# Patient Record
Sex: Male | Born: 1975 | Race: White | Hispanic: No | Marital: Married | Smoking: Never smoker
Health system: Southern US, Community
[De-identification: ages and names within clinical notes are randomized; demographics above are authoritative.]

## PROBLEM LIST (undated history)

## (undated) DIAGNOSIS — E119 Type 2 diabetes mellitus without complications: Secondary | ICD-10-CM

## (undated) DIAGNOSIS — I10 Essential (primary) hypertension: Secondary | ICD-10-CM

## (undated) DIAGNOSIS — F419 Anxiety disorder, unspecified: Secondary | ICD-10-CM

## (undated) HISTORY — PX: MYRINGOTOMY: SUR874

## (undated) HISTORY — PX: COLONOSCOPY: SHX174

---

## 2006-09-05 ENCOUNTER — Emergency Department: Payer: Self-pay | Admitting: Emergency Medicine

## 2007-09-04 ENCOUNTER — Emergency Department: Payer: Self-pay | Admitting: Emergency Medicine

## 2017-09-15 ENCOUNTER — Other Ambulatory Visit: Payer: Self-pay | Admitting: Family Medicine

## 2017-09-15 ENCOUNTER — Ambulatory Visit
Admission: RE | Admit: 2017-09-15 | Discharge: 2017-09-15 | Disposition: A | Discharge status: Home / Self Care | Payer: BLUE CROSS/BLUE SHIELD | Source: Ambulatory Visit | Attending: Family Medicine | Admitting: Family Medicine

## 2017-09-15 DIAGNOSIS — N433 Hydrocele, unspecified: Secondary | ICD-10-CM | POA: Diagnosis not present

## 2017-09-15 DIAGNOSIS — I861 Scrotal varices: Secondary | ICD-10-CM | POA: Insufficient documentation

## 2017-09-15 DIAGNOSIS — N509 Disorder of male genital organs, unspecified: Secondary | ICD-10-CM | POA: Insufficient documentation

## 2017-09-15 DIAGNOSIS — N5089 Other specified disorders of the male genital organs: Secondary | ICD-10-CM

## 2019-02-09 ENCOUNTER — Other Ambulatory Visit: Payer: Self-pay | Admitting: Family Medicine

## 2019-02-09 DIAGNOSIS — Z20822 Contact with and (suspected) exposure to covid-19: Secondary | ICD-10-CM

## 2019-02-12 LAB — NOVEL CORONAVIRUS, NAA: SARS-CoV-2, NAA: NOT DETECTED

## 2019-02-17 IMAGING — US US SCROTUM W/ DOPPLER COMPLETE
1 series · 14 of 25 positions shown · non-contrast
Comparison: None.

CLINICAL DATA: Nontender left scrotal mass.

EXAM:
SCROTAL ULTRASOUND
DOPPLER ULTRASOUND OF THE TESTICLES
TECHNIQUE: Complete ultrasound examination of the testicles, epididymis, and
other scrotal structures was performed. Color and spectral Doppler
ultrasound were also utilized to evaluate blood flow to the
testicles.

[Series 1: us scrotum w/ doppler complete · 0.07mm/px · 14 of 52 slices shown]
[im 1/52]
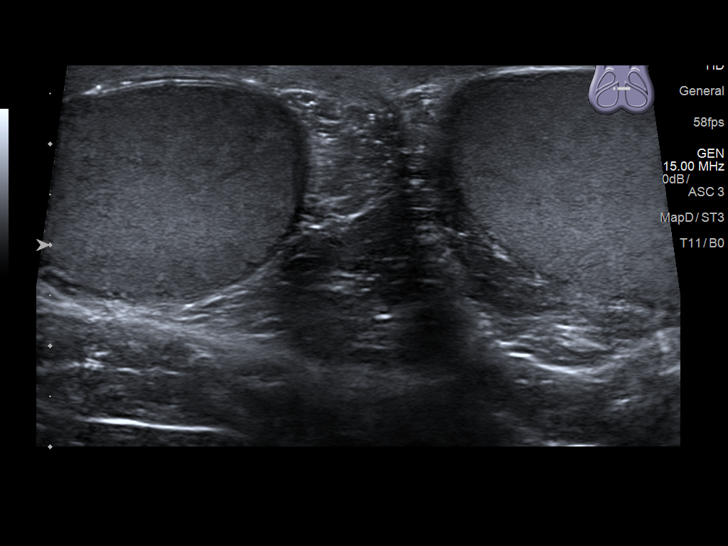
[im 5/52]
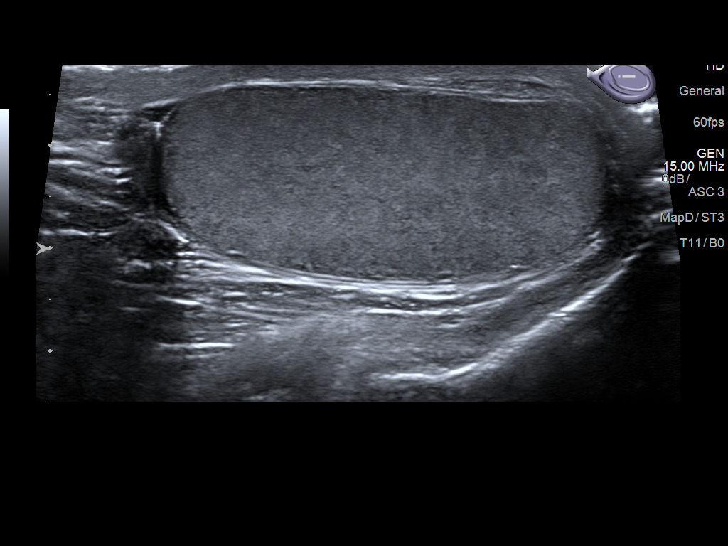
[im 9/52]
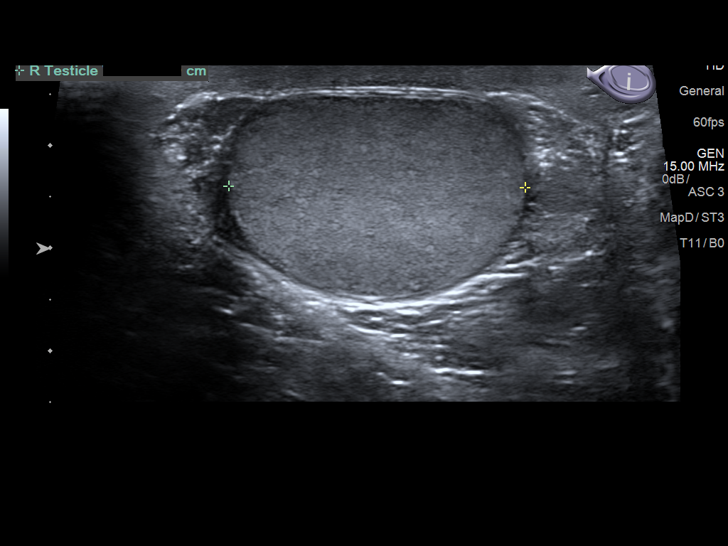
[im 13/52]
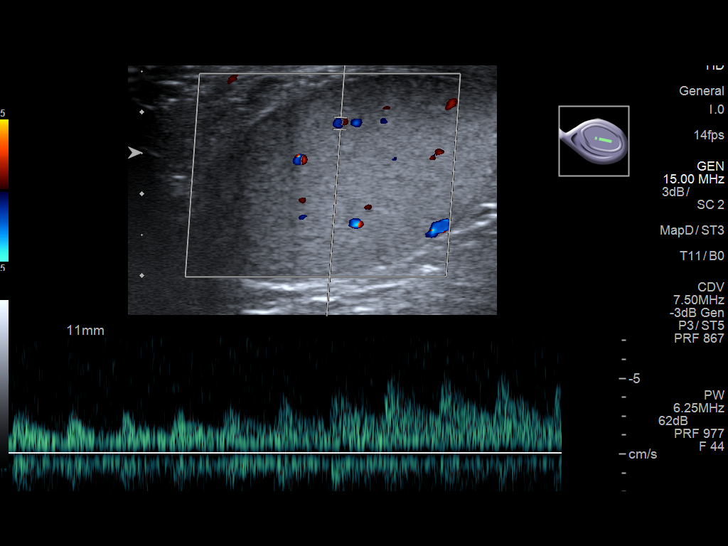
[im 18/52]
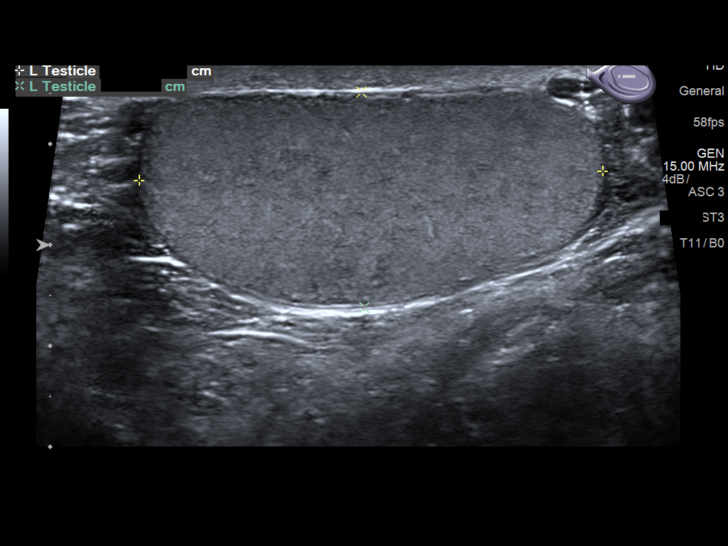
[im 20/52]
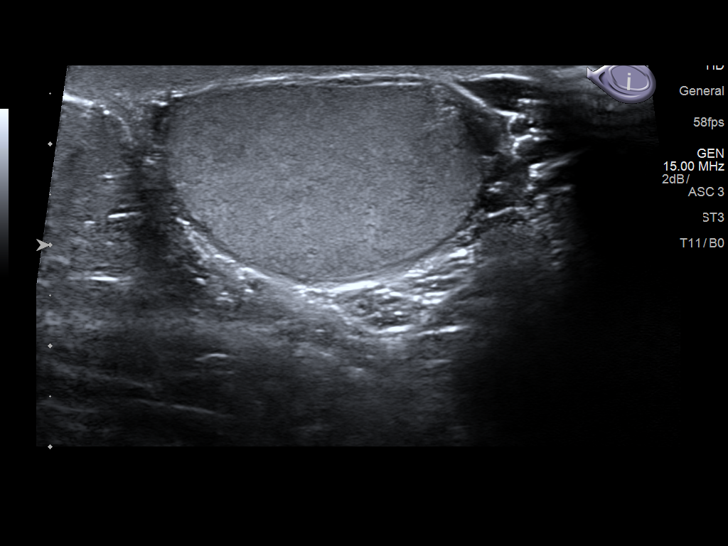
[im 24/52]
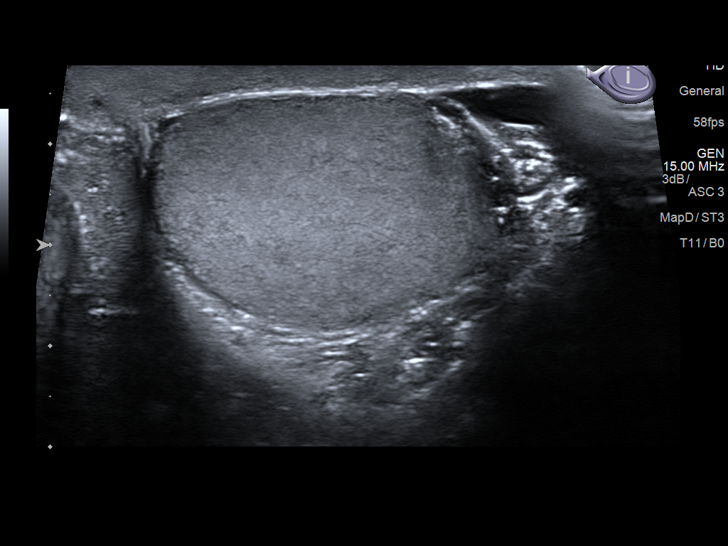
[im 28/52]
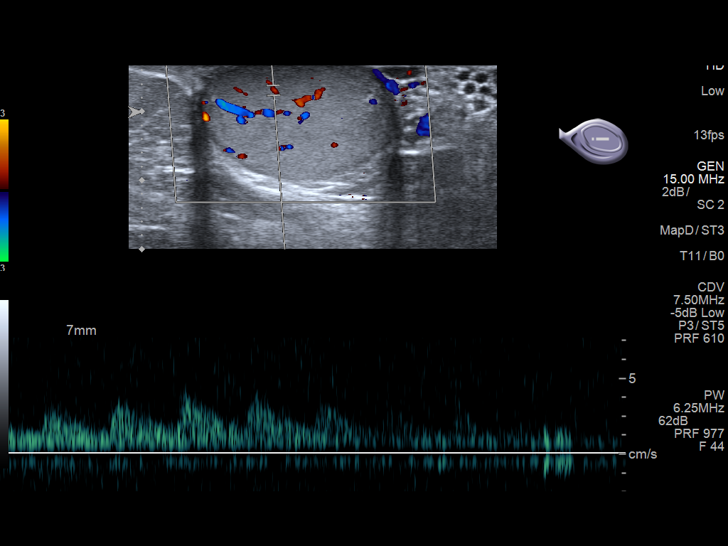
[im 32/52]
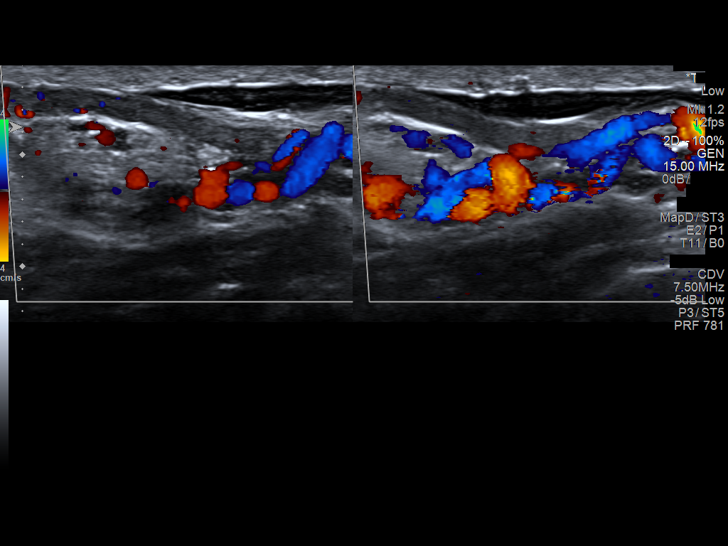
[im 35/52]
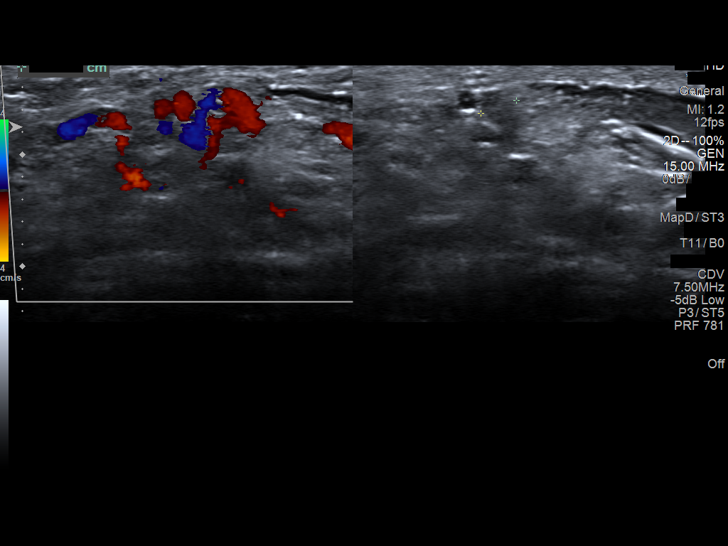
[im 39/52]
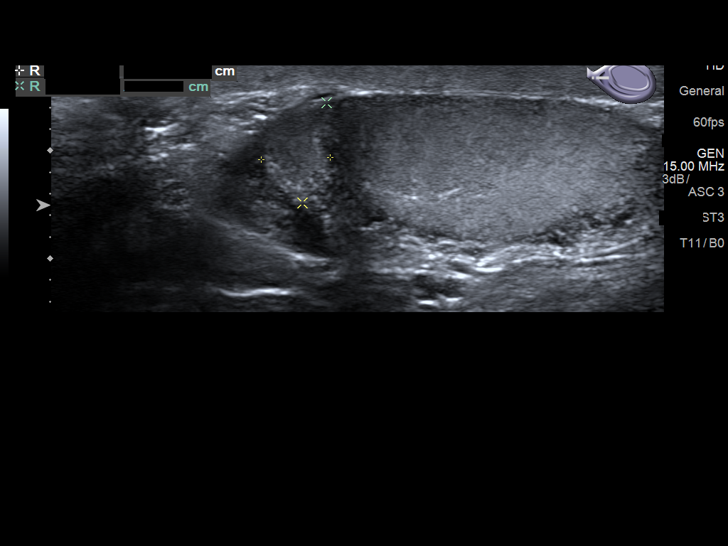
[im 43/52]
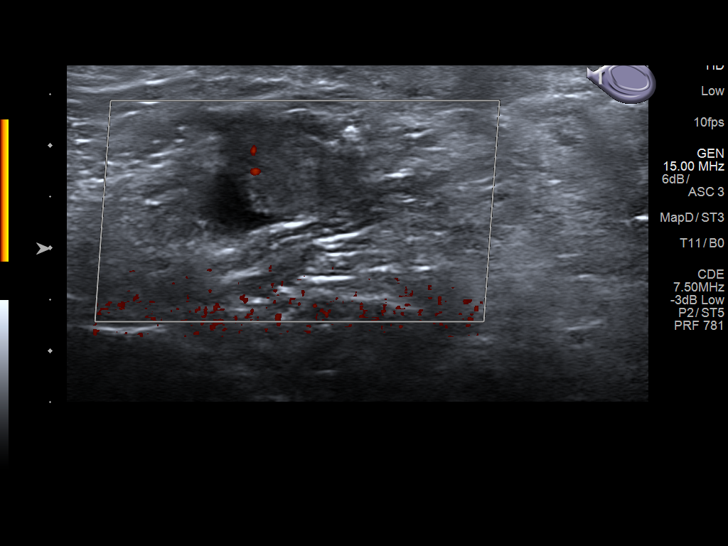
[im 47/52]
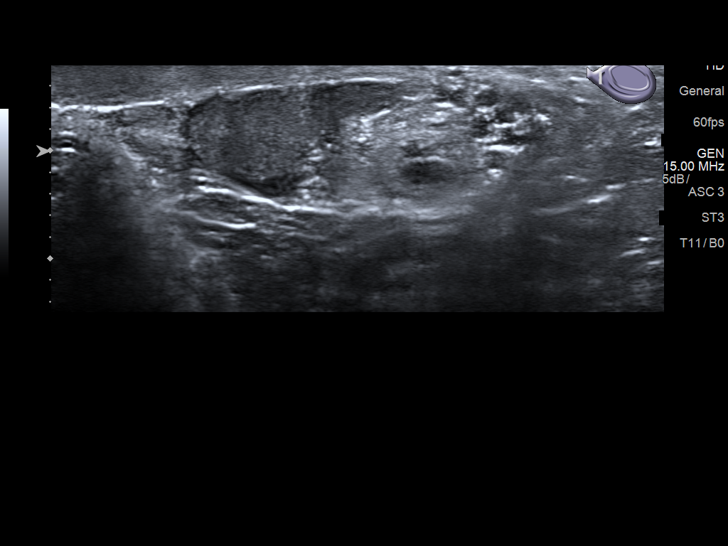
[im 52/52]
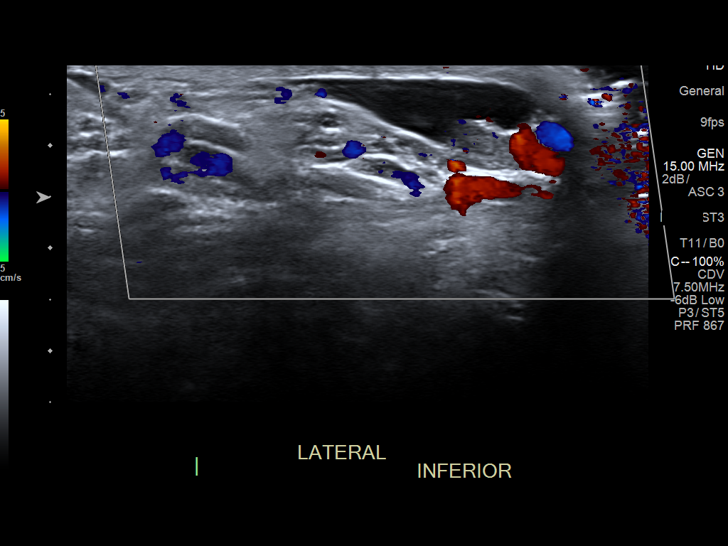

[14 of 25 positions shown; findings below may reference images not displayed]

FINDINGS: Right testicle

Measurements: 4.3 x 1.9 x 2.9 cm. No mass or microlithiasis
visualized.

Left testicle

Measurements: 4.6 x 2.1 x 3.1 cm. No mass or microlithiasis
visualized.

Right epididymis:  Normal in size and appearance.

Left epididymis:  Normal in size and appearance.

Hydrocele: Small left hydrocele.

Varicocele:  Left varicocele.

Pulsed Doppler interrogation of both testes demonstrates normal low
resistance arterial and venous waveforms bilaterally.
IMPRESSION: Left varicocele. Small left hydrocele. Normal appearing testicles.
No visible scrotal mass other than the varicocele.

## 2019-08-01 ENCOUNTER — Other Ambulatory Visit: Payer: Self-pay

## 2019-08-01 ENCOUNTER — Encounter: Payer: Self-pay | Admitting: Emergency Medicine

## 2019-08-01 ENCOUNTER — Ambulatory Visit
Admission: EM | Admit: 2019-08-01 | Discharge: 2019-08-01 | Disposition: A | Discharge status: Home / Self Care | Payer: BLUE CROSS/BLUE SHIELD | Attending: Family Medicine | Admitting: Family Medicine

## 2019-08-01 DIAGNOSIS — S90121A Contusion of right lesser toe(s) without damage to nail, initial encounter: Secondary | ICD-10-CM

## 2019-08-01 DIAGNOSIS — W2201XA Walked into wall, initial encounter: Secondary | ICD-10-CM | POA: Diagnosis not present

## 2019-08-01 HISTORY — DX: Essential (primary) hypertension: I10

## 2019-08-01 HISTORY — DX: Anxiety disorder, unspecified: F41.9

## 2019-08-01 NOTE — ED Triage Notes (Signed)
Patient states he hit his 4th toe on his right foot last night. He called out of work today and needs a note for work.

## 2019-08-01 NOTE — ED Provider Notes (Signed)
[facility] ____________________________________________  Time seen: Approximately 6:05 PM  I have reviewed the triage vital signs and the nursing notes.   HISTORY  Chief Complaint Toe Pain   HPI Javier Drake is a 44 y.o. male presenting for evaluation of injury to right fourth toe.  Patient states that last night in his house he accidentally stubbed his toe on the corner of the wall or the door.  Reports he has had pain to that toe since.  States right now he is feeling better after taking ibuprofen and applying ice.  States he works at [employer] and is on his feet a lot and due to that he called out of work today and needed a work note.  States able to still move the toe well.  Denies pain radiation, paresthesias or other injuries.  Reports otherwise doing well.   Past Medical History:  Diagnosis Date  . Anxiety   . Hypertension     There are no problems to display for this patient.   History reviewed. No pertinent surgical history.   No current facility-administered medications for this encounter.  Current Outpatient Medications:  .  aspirin 325 MG tablet, Take by mouth., Disp: , Rfl:  .  lisinopril-hydrochlorothiazide (ZESTORETIC) 20-25 MG tablet, Take by mouth., Disp: , Rfl:  .  sertraline (ZOLOFT) 100 MG tablet, Take by mouth., Disp: , Rfl:   Allergies Penicillin g  Family History  Problem Relation Age of Onset  . Healthy Mother   . Healthy Father     Social History Social History   Tobacco Use  . Smoking status: Never Smoker  . Smokeless tobacco: Never Used  Substance Use Topics  . Alcohol use: Yes  . Drug use: Never    Review of Systems Constitutional: No fever ENT: No sore throat. Cardiovascular: Denies chest pain. Respiratory: Denies shortness of breath. Musculoskeletal: As above.  Skin: Negative for rash.   ____________________________________________   PHYSICAL EXAM:  VITAL SIGNS: ED Triage Vitals  Enc Vitals Group    BP 08/01/19 1641 134/84     Pulse Rate 08/01/19 1641 73     Resp 08/01/19 1641 18     Temp 08/01/19 1641 98.7 F (37.1 C)     Temp Source 08/01/19 1641 Oral     SpO2 08/01/19 1641 99 %     Weight 08/01/19 1638 230 lb (104.3 kg)     Height 08/01/19 1638 5\' 8"  (1.727 m)     Head Circumference --      Peak Flow --      Pain Score 08/01/19 1638 0     Pain Loc --      Pain Edu? --      Excl. in [place]? --     Constitutional: Alert and oriented. Well appearing and in no acute distress. Eyes: Conjunctivae are normal.  ENT      Head: Normocephalic and atraumatic. Cardiovascular:Kermit Balo peripheral circulation. Respiratory: Normal respiratory effort without tachypnea nor retractions.  Musculoskeletal:  Steady gait.  Right foot distal dorsalis pedis and posterior tibialis pulses equal and easily palpated. Except: Right fourth toe mild amount local edema with ecchymosis with mild tenderness to proximal phalanx, right fourth toe appears normal alignment, normal distal sensation and capillary refill, right foot otherwise nontender. Neurologic:  Normal speech and language.  Skin:  Skin is warm, dry. Psychiatric: Mood and affect are normal. Speech and behavior are normal. Patient exhibits appropriate insight and judgment   ___________________________________________   LABS (all  labs ordered are listed, but only abnormal results are displayed)  Labs Reviewed - No data to display   PROCEDURES Procedures    INITIAL IMPRESSION / ASSESSMENT AND PLAN / ED COURSE  Pertinent labs & imaging results that were available during my care of the patient were reviewed by me and considered in my medical decision making (see chart for details).  Well-appearing patient.  No acute distress.  Mechanical injury of right foot last night as above.  Suspect contusion versus toe fracture, evaluated and recommended x-ray.  Patient declined x-ray at this time and states that he will treat supportively and follow-up  if pain continues and have x-ray at that time.  Toes buddy taped.  Encourage buddy taping, ice, Tylenol ibuprofen as needed.  Work note given.  Discussed follow up and return parameters including no resolution or any worsening concerns. Patient verbalized understanding and agreed to plan.   ____________________________________________   FINAL CLINICAL IMPRESSION(S) / ED DIAGNOSES  Final diagnoses:  Contusion of fourth toe of right foot, initial encounter     ED Discharge Orders    None       Note: This dictation was prepared with Dragon dictation along with smaller phrase technology. Any transcriptional errors that result from this process are unintentional.         Renford Dills, NP 08/01/19 1808

## 2019-08-01 NOTE — Discharge Instructions (Signed)
Ice.  Elevate.  Buddy tape as needed.  Over-the-counter Tylenol ibuprofen as needed.  Follow up with your primary care physician this week as needed. Return to Urgent care for new or worsening concerns.

## 2023-05-29 ENCOUNTER — Emergency Department
Admission: EM | Admit: 2023-05-29 | Discharge: 2023-05-29 | Disposition: A | Discharge status: Home / Self Care | Payer: BC Managed Care – PPO | Attending: Emergency Medicine | Admitting: Emergency Medicine

## 2023-05-29 ENCOUNTER — Emergency Department: Payer: BC Managed Care – PPO

## 2023-05-29 DIAGNOSIS — E119 Type 2 diabetes mellitus without complications: Secondary | ICD-10-CM | POA: Diagnosis not present

## 2023-05-29 DIAGNOSIS — R109 Unspecified abdominal pain: Secondary | ICD-10-CM | POA: Diagnosis present

## 2023-05-29 DIAGNOSIS — K439 Ventral hernia without obstruction or gangrene: Secondary | ICD-10-CM | POA: Insufficient documentation

## 2023-05-29 LAB — CBC
HCT: 44 % (ref 39.0–52.0)
Hemoglobin: 15.7 g/dL (ref 13.0–17.0)
MCH: 30.9 pg (ref 26.0–34.0)
MCHC: 35.7 g/dL (ref 30.0–36.0)
MCV: 86.6 fL (ref 80.0–100.0)
Platelets: 207 10*3/uL (ref 150–400)
RBC: 5.08 MIL/uL (ref 4.22–5.81)
RDW: 12.1 % (ref 11.5–15.5)
WBC: 8.1 10*3/uL (ref 4.0–10.5)
nRBC: 0 % (ref 0.0–0.2)

## 2023-05-29 LAB — URINALYSIS, ROUTINE W REFLEX MICROSCOPIC
Bilirubin Urine: NEGATIVE
Glucose, UA: NEGATIVE mg/dL
Hgb urine dipstick: NEGATIVE
Ketones, ur: NEGATIVE mg/dL
Leukocytes,Ua: NEGATIVE
Nitrite: NEGATIVE
Protein, ur: NEGATIVE mg/dL
Specific Gravity, Urine: 1.02 (ref 1.005–1.030)
pH: 5 (ref 5.0–8.0)

## 2023-05-29 LAB — COMPREHENSIVE METABOLIC PANEL
ALT: 30 U/L (ref 0–44)
AST: 30 U/L (ref 15–41)
Albumin: 4.6 g/dL (ref 3.5–5.0)
Alkaline Phosphatase: 133 U/L — ABNORMAL HIGH (ref 38–126)
Anion gap: 8 (ref 5–15)
BUN: 21 mg/dL — ABNORMAL HIGH (ref 6–20)
CO2: 28 mmol/L (ref 22–32)
Calcium: 9.4 mg/dL (ref 8.9–10.3)
Chloride: 103 mmol/L (ref 98–111)
Creatinine, Ser: 0.81 mg/dL (ref 0.61–1.24)
GFR, Estimated: >60 mL/min (ref >60)
Glucose, Bld: 132 mg/dL — ABNORMAL HIGH (ref 70–99)
Potassium: 3.7 mmol/L (ref 3.5–5.1)
Sodium: 139 mmol/L (ref 135–145)
Total Bilirubin: 0.7 mg/dL (ref <1.2)
Total Protein: 7.1 g/dL (ref 6.5–8.1)

## 2023-05-29 LAB — LIPASE, BLOOD: Lipase: 61 U/L — ABNORMAL HIGH (ref 11–51)

## 2023-05-29 MED ORDER — IOHEXOL 300 MG/ML  SOLN
100.0000 mL | Freq: Once | INTRAMUSCULAR | Status: AC | PRN
  Administered 2023-05-29: 100 mL via INTRAVENOUS

## 2023-05-29 NOTE — Discharge Instructions (Signed)
Your hernia is fat-containing at this time.  There were no loops of bowel noted in the hernia.  Follow-up with surgery to discuss your options.

## 2023-05-29 NOTE — ED Provider Notes (Signed)
[facility] Provider Note    Event Date/Time   First MD Initiated Contact with Patient 05/29/23 1721     (approximate)   History   Abdominal Pain   HPI  BRANSTON FLINT is a 47 y.o. male with history of pretension, diabetes, anxiety presents emergency department complaint of abdominal pain.  Patient has a known hernia that usually is not painful.  States over the last 2 days the pain started becoming worse.  He did have 1 episode of diarrhea.  No vomiting.  States the area has not been red.  Does go back and when he lies flat.      Physical Exam   Triage Vital Signs: ED Triage Vitals [05/29/23 1709]  Encounter Vitals Group     BP (!) 149/100     Systolic BP Percentile      Diastolic BP Percentile      Pulse Rate 66     Resp 18     Temp 97.9 F (36.6 C)     Temp Source Oral     SpO2 99 %     Weight      Height      Head Circumference      Peak Flow      Pain Score 5     Pain Loc      Pain Education      Exclude from Growth Chart     Most recent vital signs: Vitals:   05/29/23 1709  BP: (!) 149/100  Pulse: 66  Resp: 18  Temp: 97.9 F (36.6 C)  SpO2: 99%     General: Awake, no distress.   CV:  Good peripheral perfusion. regular rate and  rhythm Resp:  Normal effort.  Abd:  No distention.  Hernia noted around the umbilicus and ventral area, area is very tender to palpation, did try to reduce the hernia which was very painful for the patient Other:      ED Results / Procedures / Treatments   Labs (all labs ordered are listed, but only abnormal results are displayed) Labs Reviewed  LIPASE, BLOOD - Abnormal; Notable for the following components:      Result Value   Lipase 61 (*)    All other components within normal limits  COMPREHENSIVE METABOLIC PANEL - Abnormal; Notable for the following components:   Glucose, Bld 132 (*)    BUN 21 (*)    Alkaline Phosphatase 133 (*)    All other components within normal limits   URINALYSIS, ROUTINE W REFLEX MICROSCOPIC - Abnormal; Notable for the following components:   Color, Urine YELLOW (*)    APPearance CLEAR (*)    All other components within normal limits  CBC     EKG     RADIOLOGY CT abdomen pelvis    PROCEDURES:   Procedures   MEDICATIONS ORDERED IN ED: Medications  iohexol (OMNIPAQUE) 300 MG/ML solution 100 mL (100 mLs Intravenous Contrast Given 05/29/23 1757)     IMPRESSION / MDM / ASSESSMENT AND PLAN / ED COURSE  I reviewed the triage vital signs and the nursing notes.                              Differential diagnosis includes, but is not limited to, hernia, strangulated incarcerated hernia, abscess, appendicitis, diverticulitis, bowel obstruction  Patient's presentation is most consistent with acute illness / injury with system symptoms.   Patient's labs  are reassuring  CT abdomen pelvis IV contrast to assess for strangulated hernia  Patient's last food/liquid intake was around 2 PM   I did independently review and interpret the radiologist reading of CT abdomen pelvis.  CT is positive for a fat-containing hernia.  No bowel obstruction.  I did explain findings to the patient and his family.  Follow-up with surgery, take Tylenol and ibuprofen.  Apply ice to the area.  Return emergency department worsening.  They are in agreement treatment plan.  Discharged stable condition.   FINAL CLINICAL IMPRESSION(S) / ED DIAGNOSES   Final diagnoses:  Ventral hernia without obstruction or gangrene     Rx / DC Orders   ED Discharge Orders     None        Note:  This document was prepared using Dragon voice recognition software and may include unintentional dictation errors.    Faythe Ghee, PA-C 05/29/23 2013    Janith Lima, MD 05/30/23 [phone]

## 2023-05-29 NOTE — ED Triage Notes (Signed)
Pt to ED via POV from home. Pt reports has known hernia that usually causes no issue. Pt reports the last few days has had increased abd pain and an episode of diarrhea.

## 2023-06-07 ENCOUNTER — Encounter: Payer: Self-pay | Admitting: Surgery

## 2023-06-07 ENCOUNTER — Ambulatory Visit: Payer: BC Managed Care – PPO | Admitting: Surgery

## 2023-06-07 VITALS — BP 112/70 | HR 69 | Temp 98.0°F | Ht 68.0 in | Wt 220.0 lb

## 2023-06-07 DIAGNOSIS — K439 Ventral hernia without obstruction or gangrene: Secondary | ICD-10-CM

## 2023-06-07 DIAGNOSIS — M6208 Separation of muscle (nontraumatic), other site: Secondary | ICD-10-CM

## 2023-06-07 NOTE — Progress Notes (Signed)
06/07/2023  Reason for Visit:  Ventral hernias  History of Present Illness: Javier Drake is a 47 y.o. male presenting for evaluation of a ventral hernia.  The patient reports he's had this for a few years, but was not causing much issue at first.  Initially the hernia was easily reducible and without tenderness.  However, more recently he has noted that the hernia is bulging more, being more tender, He presented to the ED on 05/29/23 with very severe pain in the periumbilical area.  The hernia was not reducible due to pain.  He had a CT scan of abdomen/pelvis which showed a fat containing ventral hernia with three defects total.  There was no fat stranding to suggest strangulation, and his labs showed a normal WBC, so he was discharged home.  He reports that since there he's still having some soreness/tenderness but not as severe.  He has been limiting his activity level at work and been doing lighter duties.  Denies any nausea, vomiting, fever, chills.  Of note, he has a history of diabetes and his last HgA1c on 12/02/22 had been trending up to 8.0.  His metformin was increased and he did make dietary changes.  He reports he has lost 15 lbs.  His next appointment with his PCP is on 06/30/23.  Past Medical History: Past Medical History:  Diagnosis Date   Anxiety    Hypertension      Past Surgical History: History reviewed. No pertinent surgical history.  Home Medications: Prior to Admission medications   Medication Sig Start Date End Date Taking? Authorizing Provider  aspirin EC 81 MG tablet Take 81 mg by mouth daily.   Yes [provider]  lisinopril-hydrochlorothiazide (ZESTORETIC) 20-25 MG tablet Take 1 tablet by mouth daily. 06/03/22  Yes [provider]  metFORMIN (GLUCOPHAGE-XR) 500 MG 24 hr tablet Take 1,000 mg by mouth 2 (two) times daily.   Yes [provider]  Multiple Vitamin (MULTIVITAMIN) capsule Take 1 capsule by mouth daily.   Yes [provider]  rosuvastatin (CRESTOR) 5 MG tablet Take 5 mg by mouth daily.   Yes [provider]  sertraline (ZOLOFT) 100 MG tablet Take 100 mg by mouth daily. 06/03/22  Yes [provider]  traZODone (DESYREL) 100 MG tablet Take 100 mg by mouth at bedtime.   Yes [provider]    Allergies: Allergies  Allergen Reactions   Penicillin G Rash    Social History:  reports that he has never smoked. He has never been exposed to tobacco smoke. He has never used smokeless tobacco. He reports current alcohol use. He reports that he does not use drugs.   Family History: Family History  Problem Relation Age of Onset   Healthy Mother    Healthy Father     Review of Systems: Review of Systems  Constitutional:  Negative for chills and fever.  HENT:  Negative for hearing loss.   Respiratory:  Negative for shortness of breath.   Cardiovascular:  Negative for chest pain.  Gastrointestinal:  Positive for abdominal pain. Negative for constipation, nausea and vomiting.  Genitourinary:  Negative for dysuria.  Musculoskeletal:  Negative for myalgias.  Skin:  Negative for rash.  Neurological:  Negative for dizziness.  Psychiatric/Behavioral:  Negative for depression.     Physical Exam BP 112/70   Pulse 69   Temp 98 F (36.7 C)   Ht 5\' 8"  (1.727 m)   Wt 220 lb (99.8 kg)   SpO2 98%  BMI 33.45 kg/m  CONSTITUTIONAL: No acute distress HEENT:  Normocephalic, atraumatic, extraocular motion intact. NECK: Trachea is midline, and there is no jugular venous distension.  RESPIRATORY:  Lungs are clear, and breath sounds are equal bilaterally. Normal respiratory effort without pathologic use of accessory muscles. CARDIOVASCULAR: Heart is regular without murmurs, gallops, or rubs. GI: The abdomen is soft, non-distended with some mild tenderness to palpation in the supraumbilical region, at the site of his hernias.  His hernias are reducible, although with some discomfort.   He also has associated diastasis recti of the upper abdomen for a length of about 15 cm including his hernias. MUSCULOSKELETAL:  Normal muscle strength and tone in all four extremities.  No peripheral edema or cyanosis. SKIN: Skin turgor is normal. There are no pathologic skin lesions.  NEUROLOGIC:  Motor and sensation is grossly normal.  Cranial nerves are grossly intact. PSYCH:  Alert and oriented to person, place and time. Affect is normal.  Laboratory Analysis: Labs from 05/29/23: Na 139, K 3.7, Cl 103, CO2 28, BUN 21, Cr 0.81.  Total bili 0.7, AST 30, ALT 30, Alk Phos 133, albumin 4.6, lipase 61.  WBC 8.1, Hgb 15.7, Hct 44, Plt 207  Imaging: CT abdomen/pelvis on 05/29/23: IMPRESSION: 1. Moderate to marked severity diffuse urinary bladder wall thickening which may represent sequelae associated with cystitis. Correlation with urinalysis is recommended. 2. Multiple fat containing ventral hernias along the midline of the mid abdomen, just above the level of the umbilicus. 3. Several punctate foci of parenchymal low attenuation within the right lobe of the liver, likely consistent with small hepatic cysts.  Assessment and Plan: This is a 47 y.o. male with multiple ventral hernias and diastasis recti.  --Discussed with the patient the findings on his exam and CT scan.  Overall he has 3 hernia defects, in association with a diastasis recti of the upper abdomen.  Discussed how the diastasis contributes to weakening of the midline fascia which can increase risk of hernia formation.  Discussed the natural progression of hernias and he has gone from reducible asymptomatic to almost incarcerated and symptomatic hernias.  As such, I think it's prudent to proceed with surgery.  However, previously his diabetes was not well controlled and his metformin was increased.  He is pending PCP visit on 06/30/23 for repeat labs.  Discussed how uncontrolled diabetes can contribute to hernia recurrence and post-op  wound complications.  Would also be prudent to get his labs and check how his diabetes is doing. --Patient will call his PCP's office to see if the appointment can be changed sooner.  In the meantime, will tentatively schedule his robotic ventral hernia repair and plication of diastasis recti on 07/08/23.  Discussed the surgery at length with him including the planned incisions, risks of bleeding, infection, injury to surrounding structures, that this would be an outpatient surgery, activity restrictions, pain control, and he's willing to proceed.  He is on Aspirin 81 mg daily and would have to hold for 5 days prior to surgery. --If he's able to move his PCP appointment sooner, we may be able to move his surgery sooner as well. --Will give him a work note discussing his activity restrictions.    I spent 60 minutes dedicated to the care of this patient on the date of this encounter to include pre-visit review of records, face-to-face time with the patient discussing diagnosis and management, and any post-visit coordination of care.   Howie Ill, MD [facility]

## 2023-06-07 NOTE — Patient Instructions (Addendum)
Please call [facility] office to schedule an appointment to be seen sooner regarding your A1C.      You have requested to have a Ventral Hernia Repair. This will be done by Dr Aleen Campi at [facility]. Please see your (BLUE) Pre-care sheet for more information. Our surgery scheduler will call you to look at surgery dates and to go over surgery information.   You will need to arrange to be out of work for approximately 1-2 weeks and then you may return with a lifting restriction for 4 more weeks. If you have FMLA or Disability paperwork that needs to be filled out, please have your company fax your paperwork to [phone] or you may drop this by either office. This paperwork will be filled out within 3 days after your surgery has been completed.     Ventral Hernia A ventral hernia (also called an incisional hernia) is a hernia that occurs at the site of a previous surgical cut (incision) in the abdomen. The abdominal wall spans from your lower chest down to your pelvis. If the abdominal wall is weakened from a surgical incision, a hernia can occur. A hernia is a bulge of bowel or muscle tissue pushing out on the weakened part of the abdominal wall. Ventral hernias can get bigger from straining or lifting. Obese and older people are at higher risk for a ventral hernia. People who develop infections after surgery or require repeat incisions at the same site on the abdomen are also at increased risk. CAUSES  A ventral hernia occurs because of weakness in the abdominal wall at an incision site.  SYMPTOMS  Common symptoms include: A visible bulge or lump on the abdominal wall. Pain or tenderness around the lump. Increased discomfort if you cough or make a sudden movement. If the hernia has blocked part of the intestine, a serious complication can occur (incarcerated or strangulated hernia). This can become a problem that requires emergency surgery because the blood flow to the blocked intestine may be  cut off. Symptoms may include: Feeling sick to your stomach (nauseous). Throwing up (vomiting). Stomach swelling (distention) or bloating. Fever. Rapid heartbeat. DIAGNOSIS  Your health care provider will take a medical history and perform a physical exam. Various tests may be ordered, such as: Blood tests. Urine tests. Ultrasonography. X-rays. Computed tomography (CT). TREATMENT  Watchful waiting may be all that is needed for a smaller hernia that does not cause symptoms. Your health care provider may recommend the use of a supportive belt (truss) that helps to keep the abdominal wall intact. For larger hernias or those that cause pain, surgery to repair the hernia is usually recommended. If a hernia becomes strangulated, emergency surgery needs to be done right away. HOME CARE INSTRUCTIONS Avoid putting pressure or strain on the abdominal area. Avoid heavy lifting. Use good body positioning for physical tasks. Ask your health care provider about proper body positioning. Use a supportive belt as directed by your health care provider. Maintain a healthy weight. Eat foods that are high in fiber, such as whole grains, fruits, and vegetables. Fiber helps prevent difficult bowel movements (constipation). Drink enough fluids to keep your urine clear or pale yellow. Follow up with your health care provider as directed. SEEK MEDICAL CARE IF:  Your hernia seems to be getting larger or more painful. SEEK IMMEDIATE MEDICAL CARE IF:  You have abdominal pain that is sudden and sharp. Your pain becomes severe. You have repeated vomiting. You are sweating a lot. You notice  a rapid heartbeat. You develop a fever. MAKE SURE YOU:  Understand these instructions. Will watch your condition. Will get help right away if you are not doing well or get worse.     Open Ventral Hernia Repair Open ventral hernia repair is a surgery to fix a ventral hernia. A ventral hernia,  is a bulge of body tissue or  intestines that pushes through the front part of the abdomen. This can happen if the connective tissue covering the muscles over the abdomen has a weak spot or is torn because of a surgical cut (incision) from a previous surgery. A ventral hernia repair is often done soon after diagnosis to stop the hernia from getting bigger, becoming uncomfortable, or becoming an emergency. This surgery usually takes about 2 hours, but the time can vary greatly.  LET [facility] CARE PROVIDER KNOW ABOUT: Any allergies you have. All medicines you are taking, including steroids, vitamins, herbs, eye drops, creams, and over-the-counter medicines. Previous problems you or members of your family have had with the use of anesthetics. Any blood disorders you have. Previous surgeries you have had. Medical conditions you have.  RISKS AND COMPLICATIONS  Generally, Open ventral hernia repair is a safe procedure. However, as with any surgical procedure, problems can occur. Possible problems include: Bleeding. Trouble passing urine or having a bowel movement after the surgery. Infection. Pneumonia. Blood clots. Pain in the area of the hernia. A bulge in the area of the hernia that may be caused by a collection of fluid. Injury to intestines or other structures in the abdomen. Return of the hernia after surgery.  BEFORE THE PROCEDURE  You may need to have blood tests, urine tests, a chest X-ray, or an electrocardiogram done before the day of the surgery. Ask your health care provider about changing or stopping your regular medicines. This is especially important if you are taking diabetes medicines or blood thinners. You may need to wash with a special type of germ-killing soap. Do not eat or drink anything after midnight the night before the procedure or as directed by your health care provider. Make plans to have someone drive you home after the procedure.  PROCEDURE  Small monitors will be put on your body. They  are used to check your heart, blood pressure, and oxygen level. An IV access tube will be put into a vein in your hand or arm. Fluids and medicine will flow directly into your body through the IV tube. You will be given medicine that makes you go to sleep (general anesthetic). Your abdomen will be cleaned with a special soap to kill any germs on your skin. Once you are asleep, a moderate - large size incision will be made in your abdomen. The size of incision depends on how large your hernia is. Your surgeon puts the tissue or intestines that formed the hernia back in place. A screen-like patch (mesh) is used to close the hernia. This helps make the area stronger. Stitches, tacks, or staples are used to keep the mesh in place. Medicine and a bandage (dressing) or skin glue will be put over the incision.  AFTER THE PROCEDURE  You will stay in a recovery area until the anesthetic wears off. Your blood pressure and pulse will be checked often. You may be able to go home the same day or may need to stay in the hospital for 1-2 days after surgery. Your surgeon will decide when you can go home depending upon your recovery. You  may feel some pain. You will be given medicine for pain. You will be urged to do breathing exercises that involve taking deep breaths. This helps prevent a lung infection after a surgery. You may have to wear compression stockings while you are in the hospital. These stockings help keep blood clots from forming in your legs.   This information is not intended to replace advice given to you by your health care provider. Make sure you discuss any questions you have with your health care provider.   Document Released: 06/22/2012 Document Revised: 07/11/2013 Document Reviewed: 06/22/2012 Elsevier Interactive Patient Education [employer]

## 2023-06-08 ENCOUNTER — Telehealth: Payer: Self-pay | Admitting: Surgery

## 2023-06-08 NOTE — Telephone Encounter (Signed)
Left message for patient to call, please inform of the following regarding scheduled surgery with Dr. Aleen Campi.   Pre-Admission date/time, and Surgery date at [facility].  Surgery Date: 07/08/23 Preadmission Testing Date: 06/30/23 (phone 1p-4p)  Also patient will need to call at [phone], between 1-3:00pm the day before surgery, to find out what time to arrive for surgery.

## 2023-06-09 NOTE — Telephone Encounter (Signed)
Patient calls back, he is informed of all dates regarding surgery.   

## 2023-06-30 ENCOUNTER — Encounter
Admission: RE | Admit: 2023-06-30 | Discharge: 2023-06-30 | Disposition: A | Discharge status: Home / Self Care | Payer: BC Managed Care – PPO | Source: Ambulatory Visit | Attending: Surgery | Admitting: Surgery

## 2023-06-30 ENCOUNTER — Other Ambulatory Visit: Payer: Self-pay

## 2023-06-30 VITALS — Ht 68.0 in | Wt 211.0 lb

## 2023-06-30 DIAGNOSIS — E119 Type 2 diabetes mellitus without complications: Secondary | ICD-10-CM

## 2023-06-30 DIAGNOSIS — I10 Essential (primary) hypertension: Secondary | ICD-10-CM

## 2023-06-30 HISTORY — DX: Type 2 diabetes mellitus without complications: E11.9

## 2023-06-30 NOTE — Patient Instructions (Signed)
Your procedure is scheduled on: Thursday 07/08/23 To find out your arrival time, please call [phone] between 1PM - 3PM on:   Wednesday 07/07/23 Report to the Registration Desk on the 1st floor of the [facility]. FREE Valet parking is available.  If your arrival time is 6:00 am, do not arrive before that time as the [facility] entrance doors do not open until 6:00 am.  REMEMBER: Instructions that are not followed completely may result in serious medical risk, up to and including death; or upon the discretion of your surgeon and anesthesiologist your surgery may need to be rescheduled.  Do not eat food after midnight the night before surgery.  No gum chewing or hard candies.  You may however, drink CLEAR liquids up to 2 hours before you are scheduled to arrive for your surgery. Do not drink anything within 2 hours of your scheduled arrival time.  Clear liquids include: - water  Type 1 and Type 2 diabetics should only drink water.  One week prior to surgery: Stop Anti-inflammatories (NSAIDS) such as Advil, Aleve, Ibuprofen, Motrin, Naproxen, Naprosyn and Aspirin based products such as Excedrin, Goody's Powder, BC Powder. You may however, continue to take Tylenol if needed for pain up until the day of surgery.  Stop ANY OVER THE COUNTER supplements and vitamins Today 06/30/23 until after surgery.  Continue taking all prescribed medications with the exception of the following: Metformin, hold the 2 days before surgery. Last dose Monday 07/05/23  TAKE ONLY THESE MEDICATIONS THE MORNING OF SURGERY WITH A SIP OF WATER:  sertraline (ZOLOFT) 150 MG tablet   No Alcohol for 24 hours before or after surgery.  No Smoking including e-cigarettes for 24 hours before surgery.  No chewable tobacco products for at least 6 hours before surgery.  No nicotine patches on the day of surgery.  Do not use any "recreational" drugs for at least a week (preferably 2 weeks) before your surgery.   Please be advised that the combination of cocaine and anesthesia may have negative outcomes, up to and including death. If you test positive for cocaine, your surgery will be cancelled.  On the morning of surgery brush your teeth with toothpaste and water, you may rinse your mouth with mouthwash if you wish. Do not swallow any toothpaste or mouthwash.  Use CHG Soap or wipes as directed on instruction sheet.  Do not wear lotions, powders, or perfumes.   Do not shave body hair from the neck down 48 hours before surgery.  Wear clean comfortable clothing (specific to your surgery type) to the hospital.  Do not wear jewelry, make-up, hairpins, clips or nail polish.  For welded (permanent) jewelry: bracelets, anklets, waist bands, etc.  Please have this removed prior to surgery.  If it is not removed, there is a chance that hospital personnel will need to cut it off on the day of surgery. Contact lenses, hearing aids and dentures may not be worn into surgery.  Do not bring valuables to the hospital. [facility] is not responsible for any missing/lost belongings or valuables.   Notify your doctor if there is any change in your medical condition (cold, fever, infection).  If you are being discharged the day of surgery, you will not be allowed to drive home. You will need a responsible individual to drive you home and stay with you for 24 hours after surgery.   If you are taking public transportation, you will need to have a responsible individual with you.  If you are being admitted to the hospital overnight, leave your suitcase in the car. After surgery it may be brought to your room.  In case of increased patient census, it may be necessary for you, the patient, to continue your postoperative care in the Same Day Surgery department.  After surgery, you can help prevent lung complications by doing breathing exercises.  Take deep breaths and cough every 1-2 hours. Your doctor may order a  device called an Incentive Spirometer to help you take deep breaths. When coughing or sneezing, hold a pillow firmly against your incision with both hands. This is called "splinting." Doing this helps protect your incision. It also decreases belly discomfort.  Surgery Visitation Policy:  Patients undergoing a surgery or procedure may have two family members or support persons with them as long as the person is not COVID-19 positive or experiencing its symptoms.   Inpatient Visitation:    Visiting hours are 7 a.m. to 8 p.m. Up to four visitors are allowed at one time in a patient room. The visitors may rotate out with other people during the day. One designated support person (adult) may remain overnight.  Please call the Pre-admissions Testing Dept. at [phone] if you have any questions about these instructions.     Preparing for Surgery with CHLORHEXIDINE GLUCONATE (CHG) Soap  Chlorhexidine Gluconate (CHG) Soap  o An antiseptic cleaner that kills germs and bonds with the skin to continue killing germs even after washing  o Used for showering the night before surgery and morning of surgery  Before surgery, you can play an important role by reducing the number of germs on your skin.  CHG (Chlorhexidine gluconate) soap is an antiseptic cleanser which kills germs and bonds with the skin to continue killing germs even after washing.  Please do not use if you have an allergy to CHG or antibacterial soaps. If your skin becomes reddened/irritated stop using the CHG.  1. Shower the NIGHT BEFORE SURGERY and the MORNING OF SURGERY with CHG soap.  2. If you choose to wash your hair, wash your hair first as usual with your normal shampoo.  3. After shampooing, rinse your hair and body thoroughly to remove the shampoo.  4. Use CHG as you would any other liquid soap. You can apply CHG directly to the skin and wash gently with a scrungie or a clean washcloth.  5. Apply the CHG soap to  your body only from the neck down. Do not use on open wounds or open sores. Avoid contact with your eyes, ears, mouth, and genitals (private parts). Wash face and genitals (private parts) with your normal soap.  6. Wash thoroughly, paying special attention to the area where your surgery will be performed.  7. Thoroughly rinse your body with warm water.  8. Do not shower/wash with your normal soap after using and rinsing off the CHG soap.  9. Pat yourself dry with a clean towel.  10. Wear clean pajamas to bed the night before surgery.  12. Place clean sheets on your bed the night of your first shower and do not sleep with pets.  13. Shower again with the CHG soap on the day of surgery prior to arriving at the hospital.  14. Do not apply any deodorants/lotions/powders.  15. Please wear clean clothes to the hospital.

## 2023-07-02 ENCOUNTER — Encounter
Admission: RE | Admit: 2023-07-02 | Discharge: 2023-07-02 | Disposition: A | Discharge status: Home / Self Care | Payer: BC Managed Care – PPO | Source: Ambulatory Visit | Attending: Surgery | Admitting: Surgery

## 2023-07-02 DIAGNOSIS — Z01818 Encounter for other preprocedural examination: Secondary | ICD-10-CM | POA: Diagnosis present

## 2023-07-02 DIAGNOSIS — Z79899 Other long term (current) drug therapy: Secondary | ICD-10-CM | POA: Insufficient documentation

## 2023-07-02 DIAGNOSIS — Z01812 Encounter for preprocedural laboratory examination: Secondary | ICD-10-CM

## 2023-07-02 DIAGNOSIS — I10 Essential (primary) hypertension: Secondary | ICD-10-CM | POA: Insufficient documentation

## 2023-07-07 MED ORDER — CHLORHEXIDINE GLUCONATE 0.12 % MT SOLN
15.0000 mL | Freq: Once | OROMUCOSAL | Status: AC
  Administered 2023-07-08: 15 mL via OROMUCOSAL

## 2023-07-07 MED ORDER — ORAL CARE MOUTH RINSE: 15.0000 mL | Freq: Once | OROMUCOSAL | Status: AC

## 2023-07-07 MED ORDER — SODIUM CHLORIDE 0.9 % IV SOLN
INTRAVENOUS | Status: DC
Start: 2023-07-07 — End: 2023-07-08

## 2023-07-07 MED ORDER — CHLORHEXIDINE GLUCONATE CLOTH 2 % EX PADS
6.0000 | MEDICATED_PAD | Freq: Once | CUTANEOUS | Status: AC
  Administered 2023-07-08: 6 via TOPICAL

## 2023-07-07 MED ORDER — BUPIVACAINE LIPOSOME 1.3 % IJ SUSP: 20.0000 mL | Freq: Once | INTRAMUSCULAR | Status: DC

## 2023-07-07 MED ORDER — GABAPENTIN 300 MG PO CAPS
300.0000 mg | ORAL_CAPSULE | ORAL | Status: AC
Start: 2023-07-08 — End: 2023-07-09
  Administered 2023-07-08: 300 mg via ORAL

## 2023-07-07 MED ORDER — ACETAMINOPHEN 500 MG PO TABS
1000.0000 mg | ORAL_TABLET | ORAL | Status: AC
Start: 2023-07-08 — End: 2023-07-09
  Administered 2023-07-08: 1000 mg via ORAL

## 2023-07-07 MED ORDER — CEFAZOLIN SODIUM-DEXTROSE 2-4 GM/100ML-% IV SOLN
2.0000 g | INTRAVENOUS | Status: AC
  Administered 2023-07-08: 2 g via INTRAVENOUS

## 2023-07-07 MED ORDER — CHLORHEXIDINE GLUCONATE CLOTH 2 % EX PADS: 6.0000 | MEDICATED_PAD | Freq: Once | CUTANEOUS | Status: DC

## 2023-07-08 ENCOUNTER — Other Ambulatory Visit: Payer: Self-pay

## 2023-07-08 ENCOUNTER — Encounter: Payer: Self-pay | Admitting: Surgery

## 2023-07-08 ENCOUNTER — Ambulatory Visit: Payer: BC Managed Care – PPO | Admitting: General Practice

## 2023-07-08 ENCOUNTER — Encounter
Admission: RE | Disposition: A | Discharge status: Home / Self Care | Payer: Self-pay | Source: Home / Self Care | Attending: Surgery

## 2023-07-08 ENCOUNTER — Ambulatory Visit
Admission: RE | Admit: 2023-07-08 | Discharge: 2023-07-08 | Disposition: A | Discharge status: Home / Self Care | Payer: BC Managed Care – PPO | Attending: Surgery | Admitting: Surgery

## 2023-07-08 ENCOUNTER — Ambulatory Visit: Payer: BC Managed Care – PPO | Admitting: Urgent Care

## 2023-07-08 DIAGNOSIS — M6208 Separation of muscle (nontraumatic), other site: Secondary | ICD-10-CM

## 2023-07-08 DIAGNOSIS — K439 Ventral hernia without obstruction or gangrene: Secondary | ICD-10-CM

## 2023-07-08 DIAGNOSIS — Z7984 Long term (current) use of oral hypoglycemic drugs: Secondary | ICD-10-CM | POA: Diagnosis not present

## 2023-07-08 DIAGNOSIS — F419 Anxiety disorder, unspecified: Secondary | ICD-10-CM | POA: Diagnosis not present

## 2023-07-08 DIAGNOSIS — E119 Type 2 diabetes mellitus without complications: Secondary | ICD-10-CM | POA: Insufficient documentation

## 2023-07-08 DIAGNOSIS — Z79899 Other long term (current) drug therapy: Secondary | ICD-10-CM | POA: Diagnosis not present

## 2023-07-08 DIAGNOSIS — I10 Essential (primary) hypertension: Secondary | ICD-10-CM | POA: Diagnosis not present

## 2023-07-08 HISTORY — PX: XI ROBOTIC ASSISTED VENTRAL HERNIA: SHX6789

## 2023-07-08 HISTORY — PX: INSERTION OF MESH: SHX5868

## 2023-07-08 LAB — GLUCOSE, CAPILLARY
Glucose-Capillary: 111 mg/dL — ABNORMAL HIGH (ref 70–99)
Glucose-Capillary: 170 mg/dL — ABNORMAL HIGH (ref 70–99)

## 2023-07-08 SURGERY — REPAIR, HERNIA, VENTRAL, ROBOT-ASSISTED
Anesthesia: General | Site: Abdomen

## 2023-07-08 MED ORDER — DEXAMETHASONE SODIUM PHOSPHATE 10 MG/ML IJ SOLN
INTRAMUSCULAR | Status: DC | PRN
  Administered 2023-07-08: 10 mg via INTRAVENOUS

## 2023-07-08 MED ORDER — HYDROMORPHONE HCL 1 MG/ML IJ SOLN
INTRAMUSCULAR | Status: DC | PRN
  Administered 2023-07-08: 1 mg via INTRAVENOUS

## 2023-07-08 MED ORDER — ACETAMINOPHEN 500 MG PO TABS: 1000.0000 mg | ORAL_TABLET | Freq: Four times a day (QID) | ORAL | Status: AC | PRN

## 2023-07-08 MED ORDER — SODIUM CHLORIDE (PF) 0.9 % IJ SOLN
INTRAMUSCULAR | Status: DC | PRN
  Administered 2023-07-08: 80 mL via INTRAMUSCULAR

## 2023-07-08 MED ORDER — DEXMEDETOMIDINE HCL IN NACL 80 MCG/20ML IV SOLN
INTRAVENOUS | Status: DC | PRN
  Administered 2023-07-08: 12 ug via INTRAVENOUS
  Administered 2023-07-08: 8 ug via INTRAVENOUS

## 2023-07-08 MED ORDER — BUPIVACAINE-EPINEPHRINE (PF) 0.5% -1:200000 IJ SOLN
INTRAMUSCULAR | Status: AC
  Filled 2023-07-08: qty 30

## 2023-07-08 MED ORDER — PROPOFOL 10 MG/ML IV BOLUS
INTRAVENOUS | Status: DC | PRN
  Administered 2023-07-08: 200 mg via INTRAVENOUS
  Administered 2023-07-08: 50 mg via INTRAVENOUS

## 2023-07-08 MED ORDER — LACTATED RINGERS IV SOLN: INTRAVENOUS | Status: DC | PRN

## 2023-07-08 MED ORDER — ONDANSETRON HCL 4 MG/2ML IJ SOLN
INTRAMUSCULAR | Status: DC | PRN
  Administered 2023-07-08: 4 mg via INTRAVENOUS

## 2023-07-08 MED ORDER — ROCURONIUM BROMIDE 100 MG/10ML IV SOLN
INTRAVENOUS | Status: DC | PRN
  Administered 2023-07-08: 60 mg via INTRAVENOUS
  Administered 2023-07-08: 40 mg via INTRAVENOUS

## 2023-07-08 MED ORDER — MIDAZOLAM HCL 2 MG/2ML IJ SOLN
INTRAMUSCULAR | Status: AC
  Filled 2023-07-08: qty 2

## 2023-07-08 MED ORDER — CHLORHEXIDINE GLUCONATE 0.12 % MT SOLN
OROMUCOSAL | Status: AC
  Filled 2023-07-08: qty 15

## 2023-07-08 MED ORDER — GABAPENTIN 300 MG PO CAPS
ORAL_CAPSULE | ORAL | Status: AC
  Filled 2023-07-08: qty 1

## 2023-07-08 MED ORDER — IBUPROFEN 800 MG PO TABS: 800.0000 mg | ORAL_TABLET | Freq: Three times a day (TID) | ORAL | 1 refills | Status: DC | PRN

## 2023-07-08 MED ORDER — OXYCODONE HCL 5 MG/5ML PO SOLN: 5.0000 mg | Freq: Once | ORAL | Status: AC | PRN

## 2023-07-08 MED ORDER — ACETAMINOPHEN 500 MG PO TABS
ORAL_TABLET | ORAL | Status: AC
  Filled 2023-07-08: qty 2

## 2023-07-08 MED ORDER — CEFAZOLIN SODIUM-DEXTROSE 2-4 GM/100ML-% IV SOLN
INTRAVENOUS | Status: AC
  Filled 2023-07-08: qty 100

## 2023-07-08 MED ORDER — KETOROLAC TROMETHAMINE 30 MG/ML IJ SOLN
INTRAMUSCULAR | Status: DC | PRN
  Administered 2023-07-08: 30 mg via INTRAVENOUS

## 2023-07-08 MED ORDER — OXYCODONE HCL 5 MG PO TABS
ORAL_TABLET | ORAL | Status: AC
  Filled 2023-07-08: qty 1

## 2023-07-08 MED ORDER — OXYCODONE HCL 5 MG PO TABS: 5.0000 mg | ORAL_TABLET | ORAL | 0 refills | Status: DC | PRN

## 2023-07-08 MED ORDER — FENTANYL CITRATE (PF) 100 MCG/2ML IJ SOLN
INTRAMUSCULAR | Status: DC | PRN
  Administered 2023-07-08: 100 ug via INTRAVENOUS

## 2023-07-08 MED ORDER — BUPIVACAINE LIPOSOME 1.3 % IJ SUSP
INTRAMUSCULAR | Status: AC
  Filled 2023-07-08: qty 20

## 2023-07-08 MED ORDER — SUGAMMADEX SODIUM 200 MG/2ML IV SOLN
INTRAVENOUS | Status: DC | PRN
  Administered 2023-07-08: 200 mg via INTRAVENOUS

## 2023-07-08 MED ORDER — LIDOCAINE HCL (CARDIAC) PF 100 MG/5ML IV SOSY
PREFILLED_SYRINGE | INTRAVENOUS | Status: DC | PRN
  Administered 2023-07-08 (×2): 100 mg via INTRAVENOUS

## 2023-07-08 MED ORDER — FENTANYL CITRATE (PF) 100 MCG/2ML IJ SOLN
25.0000 ug | INTRAMUSCULAR | Status: DC | PRN
  Administered 2023-07-08: 50 ug via INTRAVENOUS

## 2023-07-08 MED ORDER — FENTANYL CITRATE (PF) 100 MCG/2ML IJ SOLN
INTRAMUSCULAR | Status: AC
  Filled 2023-07-08: qty 2

## 2023-07-08 MED ORDER — HYDROMORPHONE HCL 1 MG/ML IJ SOLN
INTRAMUSCULAR | Status: AC
  Filled 2023-07-08: qty 1

## 2023-07-08 MED ORDER — 0.9 % SODIUM CHLORIDE (POUR BTL) OPTIME
TOPICAL | Status: DC | PRN
  Administered 2023-07-08: 500 mL

## 2023-07-08 MED ORDER — OXYCODONE HCL 5 MG PO TABS
5.0000 mg | ORAL_TABLET | Freq: Once | ORAL | Status: AC | PRN
  Administered 2023-07-08: 5 mg via ORAL

## 2023-07-08 SURGICAL SUPPLY — 53 items
BINDER ABDOMINAL 12 ML 46-62 (SOFTGOODS) IMPLANT
CANNULA CAP OBTURATR AIRSEAL 8 (CAP) IMPLANT
COVER TIP SHEARS 8 DVNC (MISCELLANEOUS) ×2 IMPLANT
COVER WAND RF STERILE (DRAPES) ×2 IMPLANT
DERMABOND ADVANCED .7 DNX12 (GAUZE/BANDAGES/DRESSINGS) ×2 IMPLANT
DRAPE ARM DVNC X/XI (DISPOSABLE) ×6 IMPLANT
DRAPE COLUMN DVNC XI (DISPOSABLE) ×2 IMPLANT
ELECT CAUTERY BLADE TIP 2.5 (TIP) ×2
ELECT REM PT RETURN 9FT ADLT (ELECTROSURGICAL) ×2
ELECTRODE CAUTERY BLDE TIP 2.5 (TIP) ×2 IMPLANT
ELECTRODE REM PT RTRN 9FT ADLT (ELECTROSURGICAL) ×2 IMPLANT
FORCEPS BPLR R/ABLATION 8 DVNC (INSTRUMENTS) ×2 IMPLANT
GLOVE SURG SYN 7.0 (GLOVE) ×6
GLOVE SURG SYN 7.0 PF PI (GLOVE) ×4 IMPLANT
GLOVE SURG SYN 7.5 E (GLOVE) ×6
GLOVE SURG SYN 7.5 PF PI (GLOVE) ×4 IMPLANT
GOWN STRL REUS W/ TWL LRG LVL3 (GOWN DISPOSABLE) ×6 IMPLANT
GRASPER SUT TROCAR 14GX15 (MISCELLANEOUS) ×2 IMPLANT
IRRIGATION STRYKERFLOW (MISCELLANEOUS) IMPLANT
IRRIGATOR STRYKERFLOW (MISCELLANEOUS)
IV NS 1000ML BAXH (IV SOLUTION) IMPLANT
KIT PINK PAD W/HEAD ARE REST (MISCELLANEOUS) ×2
KIT PINK PAD W/HEAD ARM REST (MISCELLANEOUS) ×2 IMPLANT
LABEL OR SOLS (LABEL) ×2 IMPLANT
MANIFOLD NEPTUNE II (INSTRUMENTS) ×2 IMPLANT
MESH VENT LT ST 15CM CRL ECHO2 (Mesh General) IMPLANT
NDL DRIVE SUT CUT DVNC (INSTRUMENTS) ×2 IMPLANT
NDL HYPO 22X1.5 SAFETY MO (MISCELLANEOUS) ×2 IMPLANT
NDL INSUFFLATION 14GA 120MM (NEEDLE) ×2 IMPLANT
NEEDLE DRIVE SUT CUT DVNC (INSTRUMENTS) ×2
NEEDLE HYPO 22X1.5 SAFETY MO (MISCELLANEOUS) ×2
NEEDLE INSUFFLATION 14GA 120MM (NEEDLE) ×2
OBTURATOR OPTICAL STND 8 DVNC (TROCAR) ×2
OBTURATOR OPTICALSTD 8 DVNC (TROCAR) ×2 IMPLANT
PACK LAP CHOLECYSTECTOMY (MISCELLANEOUS) ×2 IMPLANT
SCISSORS MNPLR CVD DVNC XI (INSTRUMENTS) ×2 IMPLANT
SEAL UNIV 5-12 XI (MISCELLANEOUS) ×4 IMPLANT
SET TUBE FILTERED XL AIRSEAL (SET/KITS/TRAYS/PACK) IMPLANT
SET TUBE SMOKE EVAC HIGH FLOW (TUBING) ×2 IMPLANT
SOL ELECTROSURG ANTI STICK (MISCELLANEOUS) ×2
SOLUTION ELECTROSURG ANTI STCK (MISCELLANEOUS) ×2 IMPLANT
SPONGE T-LAP 18X18 ~~LOC~~+RFID (SPONGE) ×2 IMPLANT
SUT MNCRL 4-0 27XMFL (SUTURE) ×2
SUT STRATA 2-0 30 CT-2 (SUTURE) ×4 IMPLANT
SUT STRATAFIX PDS 30 CT-1 (SUTURE) ×2 IMPLANT
SUT VICRYL 0 UR6 27IN ABS (SUTURE) ×4 IMPLANT
SUTURE MNCRL 4-0 27XMF (SUTURE) ×2 IMPLANT
SYS BAG RETRIEVAL 10MM (BASKET) ×2
SYSTEM BAG RETRIEVAL 10MM (BASKET) IMPLANT
TAPE TRANSPORE STRL 2 31045 (GAUZE/BANDAGES/DRESSINGS) ×2 IMPLANT
TRAP FLUID SMOKE EVACUATOR (MISCELLANEOUS) ×2 IMPLANT
TRAY FOLEY SLVR 16FR LF STAT (SET/KITS/TRAYS/PACK) ×2 IMPLANT
WATER STERILE IRR 500ML POUR (IV SOLUTION) ×2 IMPLANT

## 2023-07-08 NOTE — Transfer of Care (Signed)
Immediate Anesthesia Transfer of Care Note  Patient: Javier Drake  Procedure(s) Performed: XI ROBOTIC ASSISTED VENTRAL HERNIA with plication of diastasis recti INSERTION OF MESH (Abdomen)  Patient Location: PACU  Anesthesia Type:General  Level of Consciousness: awake and alert   Airway & Oxygen Therapy: Patient Spontanous Breathing and Patient connected to face mask oxygen  Post-op Assessment: Report given to RN and Post -op Vital signs reviewed and stable  Post vital signs: stable  Last Vitals:  Vitals Value Taken Time  BP 161/84 07/08/23 1607  Temp    Pulse 116 07/08/23 1612  Resp 17 07/08/23 1612  SpO2 87 % 07/08/23 1612  Vitals shown include unfiled device data.  Last Pain:  Vitals:   07/08/23 1236  TempSrc: Temporal  PainSc: 0-No pain         Complications: No notable events documented.

## 2023-07-08 NOTE — Anesthesia Procedure Notes (Signed)
Procedure Name: Intubation Date/Time: 07/08/2023 1:52 PM  Performed by: Maryla Morrow., CRNAPre-anesthesia Checklist: Patient identified, Patient being monitored, Timeout performed, Emergency Drugs available and Suction available Patient Re-evaluated:Patient Re-evaluated prior to induction Oxygen Delivery Method: Circle system utilized Preoxygenation: Pre-oxygenation with 100% oxygen Induction Type: IV induction Ventilation: Mask ventilation without difficulty Laryngoscope Size: McGrath and 4 Grade View: Grade I Tube type: Oral Tube size: 7.5 mm Number of attempts: 1 Airway Equipment and Method: Stylet Placement Confirmation: ETT inserted through vocal cords under direct vision, positive ETCO2 and breath sounds checked- equal and bilateral Secured at: 21 cm Tube secured with: Tape Dental Injury: Teeth and Oropharynx as per pre-operative assessment

## 2023-07-08 NOTE — Discharge Instructions (Signed)
Discharge Instructions: 1.  Patient may shower, but do not scrub wounds heavily and dab dry only. 2.  Do not submerge wounds in pool/tub until fully healed. 3.  Do not apply ointments or hydrogen peroxide to the wounds. 4.  May apply ice packs to the wounds for comfort. 5.  Please wear abdominal binder at all times for the next 4 weeks.  May remove for showers. 6.  Do not drive while taking narcotics for pain control.  Prior to driving, make sure you are able to rotate right and left to look at blindspots without significant pain or discomfort. 7.  No heavy lifting or pushing of more than 10-15 lbs for 6 weeks.

## 2023-07-08 NOTE — Op Note (Signed)
Procedure Date:  07/08/2023  Pre-operative Diagnosis:  Reducible Ventral hernias and diastasis recti  Post-operative Diagnosis: Reducible ventral hernias, total defect length 7.5 cm, diastasis recti with widest separation 4.5 cm.  Procedure:  Robotic assisted Ventral Hernia Repair with mesh; plication of diastasis recti.  Surgeon:  Howie Ill, MD  Anesthesia:  General endotracheal  Estimated Blood Loss:  15 ml  Specimens:  None  Complications:  None  Indications for Procedure:  This is a 47 y.o. male who presents with 3 ventral hernias in the setting of diastasis recti.  The options of surgery versus observation were reviewed with the patient and/or family. The risks of bleeding, abscess or infection, recurrence of symptoms, potential for an open procedure, injury to surrounding structures, and chronic pain were all discussed with the patient and was willing to proceed.  Description of Procedure: The patient was correctly identified in the preoperative area and brought into the operating room.  The patient was placed supine with VTE prophylaxis in place.  Appropriate time-outs were performed.  Anesthesia was induced and the patient was intubated.  Appropriate antibiotics were infused.  The abdomen was prepped and draped in a sterile fashion. The patient's hernia defects were marked with a marking pen.  A Veress needle was introduced in the left upper quadrant and pneumoperitoneum was obtained with appropriate pressures.  Using Optiview technique, an 8 mm port was introduced in the left lateral abdominal wall without complications.  Then, a 12 mm port was introduced in the left upper quadrant and an 8 mm port in the left lower quadrant under direct visualization.  The DaVinci platform was docked, camera targeted, and instruments placed under direct visualization.  The patient's hernias contained omentum.  They were fully reduced and the peritoneum and preperitoneal fat were dissected  and resected to allow better exposure of the hernia defects and for better mesh placement.  The hernias had a combined length of 7.5 cm.  The patient also had diastasis recti of the upper abdomen, with widest separation of 4.5 cm.  A 4 x 6 inch Bard Ventralight ST Echo mesh, three 0 Stratafix sutures, and three 2-0 V-loc sutures were inserted through the 12 mm port under direct visualization.  The hernia defects were closed using the stratafix sutures while plicating the diastasis.  A PMI was brought through the center of the repair line and the positioning system of the mesh was passed through.  This allowed the mesh to splay open and be centered over the repair site with good overlap.  The mesh was then sutured in place circumferentially and through the center of the mesh using the V-loc sutures.  All needles and the positioning system were then removed through the 12 mm port without complications.  The preperitoneal fat was placed in an Endocatch bag.  The DaVinci platform was then undocked and instruments removed.    80 ml of Exparel solution mixed with 0.5% bupivacaine with epi and saline was infiltrated around the mesh edges, hernia repair site, and port sites.  The 12 mm port was removed and the Endocatch bag retrieved.  The fascia was closed under direct visualization utilizing an Endo Close technique with 0 Vicryl suture.  The 8 mm ports were removed. The 12 mm incision was closed using 3-0 Vicryl and 4-0 Monocryl, and the other port incisions were closed with 4-0 Monocryl.  The wounds were cleaned and sealed with DermaBond.  The patient was emerged from anesthesia and extubated and brought to the  recovery room for further management.  The patient tolerated the procedure well and all counts were correct at the end of the case.   Howie Ill, MD

## 2023-07-08 NOTE — H&P (Signed)
07/08/2023  Reason for Visit:  Ventral hernias  History of Present Illness: Javier Drake is a 47 y.o. male presenting for evaluation of a ventral hernia.  The patient reports he's had this for a few years, but was not causing much issue at first.  Initially the hernia was easily reducible and without tenderness.  However, more recently he has noted that the hernia is bulging more, being more tender, He presented to the ED on 05/29/23 with very severe pain in the periumbilical area.  The hernia was not reducible due to pain.  He had a CT scan of abdomen/pelvis which showed a fat containing ventral hernia with three defects total.  There was no fat stranding to suggest strangulation, and his labs showed a normal WBC, so he was discharged home.  He reports that since there he's still having some soreness/tenderness but not as severe.  He has been limiting his activity level at work and been doing lighter duties.  Denies any nausea, vomiting, fever, chills.  Of note, he has a history of diabetes and his last HgA1c on 12/02/22 had been trending up to 8.0.  His metformin was increased and he did make dietary changes.  He reports he has lost 15 lbs.  His last A1c checked was down to 6.9 on 06/30/23.  Past Medical History: Past Medical History:  Diagnosis Date   Anxiety    Diabetes mellitus without complication (HCC)    Hypertension      Past Surgical History: Past Surgical History:  Procedure Laterality Date   COLONOSCOPY     MYRINGOTOMY      Home Medications: Prior to Admission medications   Medication Sig Start Date End Date Taking? Authorizing Provider  aspirin EC 81 MG tablet Take 81 mg by mouth daily.   Yes [provider]  lisinopril-hydrochlorothiazide (ZESTORETIC) 20-25 MG tablet Take 1 tablet by mouth daily. 06/03/22  Yes [provider]  metFORMIN (GLUCOPHAGE-XR) 500 MG 24 hr tablet Take 1,000 mg by mouth 2 (two) times daily.   Yes [provider]   Multiple Vitamin (MULTIVITAMIN) capsule Take 1 capsule by mouth daily.   Yes [provider]  rosuvastatin (CRESTOR) 5 MG tablet Take 5 mg by mouth daily.   Yes [provider]  sertraline (ZOLOFT) 100 MG tablet Take 100 mg by mouth daily. 06/03/22  Yes [provider]  traZODone (DESYREL) 100 MG tablet Take 100 mg by mouth at bedtime.   Yes [provider]    Allergies: Allergies  Allergen Reactions   Penicillin G Rash    Social History:  reports that he has never smoked. He has never been exposed to tobacco smoke. He has never used smokeless tobacco. He reports current alcohol use. He reports that he does not use drugs.   Family History: Family History  Problem Relation Age of Onset   Healthy Mother    Healthy Father     Review of Systems: Review of Systems  Constitutional:  Negative for chills and fever.  HENT:  Negative for hearing loss.   Respiratory:  Negative for shortness of breath.   Cardiovascular:  Negative for chest pain.  Gastrointestinal:  Positive for abdominal pain. Negative for constipation, nausea and vomiting.  Genitourinary:  Negative for dysuria.  Musculoskeletal:  Negative for myalgias.  Skin:  Negative for rash.  Neurological:  Negative for dizziness.  Psychiatric/Behavioral:  Negative for depression.     Physical Exam BP (!) 142/91   Pulse 80  Temp 98.4 F (36.9 C) (Temporal)   Resp 17   SpO2 97%  CONSTITUTIONAL: No acute distress HEENT:  Normocephalic, atraumatic, extraocular motion intact. NECK: Trachea is midline, and there is no jugular venous distension.  RESPIRATORY:  Lungs are clear, and breath sounds are equal bilaterally. Normal respiratory effort without pathologic use of accessory muscles. CARDIOVASCULAR: Heart is regular without murmurs, gallops, or rubs. GI: The abdomen is soft, non-distended with some mild tenderness to palpation in the supraumbilical region, at the site of his hernias.  His  hernias are reducible, although with some discomfort.  He also has associated diastasis recti of the upper abdomen for a length of about 15 cm including his hernias. MUSCULOSKELETAL:  Normal muscle strength and tone in all four extremities.  No peripheral edema or cyanosis. SKIN: Skin turgor is normal. There are no pathologic skin lesions.  NEUROLOGIC:  Motor and sensation is grossly normal.  Cranial nerves are grossly intact. PSYCH:  Alert and oriented to person, place and time. Affect is normal.  Laboratory Analysis: Labs from 05/29/23: Na 139, K 3.7, Cl 103, CO2 28, BUN 21, Cr 0.81.  Total bili 0.7, AST 30, ALT 30, Alk Phos 133, albumin 4.6, lipase 61.  WBC 8.1, Hgb 15.7, Hct 44, Plt 207  Imaging: CT abdomen/pelvis on 05/29/23: IMPRESSION: 1. Moderate to marked severity diffuse urinary bladder wall thickening which may represent sequelae associated with cystitis. Correlation with urinalysis is recommended. 2. Multiple fat containing ventral hernias along the midline of the mid abdomen, just above the level of the umbilicus. 3. Several punctate foci of parenchymal low attenuation within the right lobe of the liver, likely consistent with small hepatic cysts.  Assessment and Plan: This is a 47 y.o. male with multiple ventral hernias and diastasis recti.  --Discussed with the patient the findings on his exam and CT scan.  Overall he has 3 hernia defects, in association with a diastasis recti of the upper abdomen.  Discussed how the diastasis contributes to weakening of the midline fascia which can increase risk of hernia formation.  Discussed the natural progression of hernias and he has gone from reducible asymptomatic to almost incarcerated and symptomatic hernias.  As such, I think it's prudent to proceed with surgery.  His diabetes is better controlled now and we can proceed. --Patient will be scheduled for robotic ventral hernia repair and plication of diastasis recti on 07/08/23.   Discussed the surgery at length with him including the planned incisions, risks of bleeding, infection, injury to surrounding structures, that this would be an outpatient surgery, activity restrictions, pain control, and he's willing to proceed.  He is on Aspirin 81 mg daily and would have to hold for 5 days prior to surgery. --Will give him a work note discussing his activity restrictions.     Howie Ill, MD [facility]

## 2023-07-08 NOTE — Anesthesia Preprocedure Evaluation (Signed)
Anesthesia Evaluation  Patient identified by MRN, date of birth, ID band Patient awake    Reviewed: Allergy & Precautions, NPO status , Patient's Chart, lab work & pertinent test results  Airway Mallampati: I  TM Distance: >3 FB Neck ROM: full    Dental  (+) Dental Advidsory Given   Pulmonary neg pulmonary ROS   Pulmonary exam normal        Cardiovascular hypertension, On Medications negative cardio ROS Normal cardiovascular exam     Neuro/Psych  PSYCHIATRIC DISORDERS Anxiety     negative neurological ROS     GI/Hepatic negative GI ROS, Neg liver ROS,,,  Endo/Other  negative endocrine ROSdiabetes    Renal/GU      Musculoskeletal   Abdominal   Peds  Hematology negative hematology ROS (+)   Anesthesia Other Findings Past Medical History: No date: Anxiety No date: Diabetes mellitus without complication (HCC) No date: Hypertension  Past Surgical History: No date: COLONOSCOPY No date: MYRINGOTOMY     Reproductive/Obstetrics negative OB ROS                             Anesthesia Physical Anesthesia Plan  ASA: 2  Anesthesia Plan: General ETT and General   Post-op Pain Management:    Induction: Intravenous  PONV Risk Score and Plan: 2 and Ondansetron, Dexamethasone and Midazolam  Airway Management Planned: Oral ETT  Additional Equipment:   Intra-op Plan:   Post-operative Plan: Extubation in OR  Informed Consent: I have reviewed the patients History and Physical, chart, labs and discussed the procedure including the risks, benefits and alternatives for the proposed anesthesia with the patient or authorized representative who has indicated his/her understanding and acceptance.     Dental Advisory Given  Plan Discussed with: Anesthesiologist, CRNA and Surgeon  Anesthesia Plan Comments: (Patient consented for risks of anesthesia including but not limited to:  - adverse  reactions to medications - damage to eyes, teeth, lips or other oral mucosa - nerve damage due to positioning  - sore throat or hoarseness - Damage to heart, brain, nerves, lungs, other parts of body or loss of life  Patient voiced understanding and assent.)       Anesthesia Quick Evaluation

## 2023-07-09 ENCOUNTER — Encounter: Payer: Self-pay | Admitting: Surgery

## 2023-07-09 NOTE — Anesthesia Postprocedure Evaluation (Signed)
Anesthesia Post Note  Patient: [facility]  Procedure(s) Performed: XI ROBOTIC ASSISTED VENTRAL HERNIA with plication of diastasis recti INSERTION OF MESH (Abdomen)  Patient location during evaluation: PACU Anesthesia Type: General Level of consciousness: awake and alert Pain management: pain level controlled Vital Signs Assessment: post-procedure vital signs reviewed and stable Respiratory status: spontaneous breathing, nonlabored ventilation, respiratory function stable and patient connected to nasal cannula oxygen Cardiovascular status: blood pressure returned to baseline and stable Postop Assessment: no apparent nausea or vomiting Anesthetic complications: no  There were no known notable events for this encounter.   Last Vitals:  Vitals:   07/08/23 1704 07/08/23 1721  BP:  (!) 146/93  Pulse:  95  Resp:  17  Temp:  36.9 C  SpO2: 97% 95%    Last Pain:  Vitals:   07/08/23 1721  TempSrc: Oral  PainSc: 4                  Stephanie Coup

## 2023-07-26 ENCOUNTER — Encounter: Payer: Self-pay | Admitting: Surgery

## 2023-07-26 ENCOUNTER — Ambulatory Visit (INDEPENDENT_AMBULATORY_CARE_PROVIDER_SITE_OTHER): Payer: BC Managed Care – PPO | Admitting: Surgery

## 2023-07-26 VITALS — BP 133/84 | HR 118 | Temp 98.8°F | Ht 68.0 in | Wt 216.0 lb

## 2023-07-26 DIAGNOSIS — K439 Ventral hernia without obstruction or gangrene: Secondary | ICD-10-CM | POA: Diagnosis not present

## 2023-07-26 DIAGNOSIS — Z09 Encounter for follow-up examination after completed treatment for conditions other than malignant neoplasm: Secondary | ICD-10-CM | POA: Diagnosis not present

## 2023-07-26 DIAGNOSIS — M6208 Separation of muscle (nontraumatic), other site: Secondary | ICD-10-CM

## 2023-07-26 NOTE — Progress Notes (Signed)
 07/26/2023  History of Present Illness: GADDIEL CULLENS is a 48 y.o. male status post robotic ventral hernia repair with plication of diastases recti.  The patient's hernias were contained within the diastases with a total defect length of 7.5 cm.  Patient presents today for follow-up.  Overall reports that he has been doing well and reports that his initial discomfort is improving.  Initially he did require oxycodone  for pain control but now has been taking ibuprofen  sporadically.  The main discomfort he feels is depending on his movements.  He is wearing the abdominal binder as instructed.  Past Medical History: Past Medical History:  Diagnosis Date   Anxiety    Diabetes mellitus without complication (HCC)    Hypertension      Past Surgical History: Past Surgical History:  Procedure Laterality Date   COLONOSCOPY     INSERTION OF MESH N/A 07/08/2023   Procedure: INSERTION OF MESH;  Surgeon: Desiderio Schanz, MD;  Location: [facility] ORS;  Service: General;  Laterality: N/A;  ventral hernia   MYRINGOTOMY     XI ROBOTIC ASSISTED VENTRAL HERNIA N/A 07/08/2023   Procedure: XI ROBOTIC ASSISTED VENTRAL HERNIA with plication of diastasis recti;  Surgeon: Desiderio Schanz, MD;  Location: [facility] ORS;  Service: General;  Laterality: N/A;    Home Medications: Prior to Admission medications   Medication Sig Start Date End Date Taking? Authorizing Provider  acetaminophen  (TYLENOL ) 500 MG tablet Take 2 tablets (1,000 mg total) by mouth every 6 (six) hours as needed for mild pain (pain score 1-3). 07/08/23  Yes Pookela Sellin, MD  Glucosamine HCl (GLUCOSAMINE PO) Take 2 tablets by mouth daily.   Yes [provider]  lisinopril-hydrochlorothiazide (ZESTORETIC) 20-25 MG tablet Take 1 tablet by mouth daily. 06/03/22  Yes [provider]  metFORMIN (GLUCOPHAGE-XR) 500 MG 24 hr tablet Take 1,000 mg by mouth 2 (two) times daily.   Yes [provider]  Multiple Vitamin (MULTIVITAMIN) capsule  Take 1 capsule by mouth daily.   Yes [provider]  rosuvastatin (CRESTOR) 5 MG tablet Take 5 mg by mouth at bedtime.   Yes [provider]  sertraline (ZOLOFT) 100 MG tablet Take 150 mg by mouth daily. 06/03/22  Yes [provider]  traZODone (DESYREL) 100 MG tablet Take 100 mg by mouth at bedtime.   Yes [provider]    Allergies: Allergies  Allergen Reactions   Penicillin G Rash    Review of Systems: Review of Systems  Constitutional:  Negative for chills and fever.  Respiratory:  Negative for shortness of breath.   Cardiovascular:  Negative for chest pain.  Gastrointestinal:  Positive for abdominal pain (post-op soreness improving.). Negative for nausea and vomiting.    Physical Exam BP 133/84   Pulse (!) 118   Temp 98.8 F (37.1 C) (Oral)   Ht 5' 8 (1.727 m)   Wt 216 lb (98 kg)   SpO2 96%   BMI 32.84 kg/m  CONSTITUTIONAL: No acute distress HEENT:  Normocephalic, atraumatic, extraocular motion intact. RESPIRATORY:  Normal respiratory effort without pathologic use of accessory muscles. CARDIOVASCULAR: Regular rhythm and rate. GI: The abdomen is soft, nondistended, appropriately sore to palpation.  Patient's incisions are clean, dry, intact without any evidence of dehiscence.  The ventral area without any evidence of hernia recurrence.  No significant diastases noticeable.  Abdominal binder replaced.   NEUROLOGIC:  Motor and sensation is grossly normal.  Cranial nerves are grossly intact. PSYCH:  Alert and oriented to person, place  and time. Affect is normal.   Assessment and Plan: This is a 48 y.o. male s/p robotic ventral hernia repair plication of diastases recti.  - Patient is healing appropriately and is having some discomfort which is normal after surgery.  Discussed with him that this will continue to improve.  Recommend that he continue wearing the abdominal binder to help with comfort and wound healing.  He should continue  with the binder for total of 4 weeks after surgery. - Reminded him of activity restrictions. - Follow-up as needed.  I spent 20 minutes dedicated to the care of this patient on the date of this encounter to include pre-visit review of records, face-to-face time with the patient discussing diagnosis and management, and any post-visit coordination of care.   Aloysius Sheree Plant, MD [facility]

## 2023-07-26 NOTE — Patient Instructions (Signed)

## 2023-07-28 ENCOUNTER — Telehealth: Payer: Self-pay | Admitting: Surgery

## 2023-07-28 NOTE — Telephone Encounter (Signed)
 Patient had ventral hernia repair done on 07/08/23 with Dr. Desiderio.  Patient needs a work note faxed to [place] and they need work note to be very specific to include the following:    Date of surgery and what type of surgery, length out of work from 07/08/23 through 08/19/23 and list restrictions in that work note. Also in the note needs to state that he had post op on 07/26/23 and follow up as needed.  Work note needs to be faxed to [facility] [employer] [facility] @ [phone] with attention claim # [id number].  Also add to patient's mychart and let know when this has been faxed.  Thank you.

## 2023-07-28 NOTE — Telephone Encounter (Signed)
 Work note has been sent to [place] and to the [employer]
